# Patient Record
Sex: Female | Born: 1935 | Race: White | Hispanic: No | Marital: Married
Health system: Southern US, Community
[De-identification: ages and names within clinical notes are randomized; demographics above are authoritative.]

---

## 2004-03-30 ENCOUNTER — Other Ambulatory Visit: Payer: Self-pay

## 2004-04-05 ENCOUNTER — Other Ambulatory Visit: Payer: Self-pay

## 2004-04-20 ENCOUNTER — Other Ambulatory Visit: Payer: Self-pay

## 2005-07-19 ENCOUNTER — Ambulatory Visit: Payer: Self-pay | Admitting: Internal Medicine

## 2006-01-14 ENCOUNTER — Other Ambulatory Visit: Payer: Self-pay

## 2006-01-15 ENCOUNTER — Inpatient Hospital Stay: Payer: Self-pay | Admitting: Internal Medicine

## 2006-01-23 ENCOUNTER — Encounter: Payer: Self-pay | Admitting: Internal Medicine

## 2006-07-04 ENCOUNTER — Ambulatory Visit: Payer: Self-pay | Admitting: Unknown Physician Specialty

## 2006-07-23 ENCOUNTER — Ambulatory Visit: Payer: Self-pay | Admitting: Internal Medicine

## 2007-07-25 ENCOUNTER — Ambulatory Visit: Payer: Self-pay | Admitting: Internal Medicine

## 2008-08-05 ENCOUNTER — Ambulatory Visit: Payer: Self-pay | Admitting: Internal Medicine

## 2009-08-12 ENCOUNTER — Ambulatory Visit: Payer: Self-pay | Admitting: Internal Medicine

## 2010-08-15 ENCOUNTER — Ambulatory Visit: Payer: Self-pay | Admitting: Internal Medicine

## 2011-08-17 ENCOUNTER — Ambulatory Visit: Payer: Self-pay | Admitting: Internal Medicine

## 2012-08-20 ENCOUNTER — Ambulatory Visit: Payer: Self-pay | Admitting: Internal Medicine

## 2013-05-28 ENCOUNTER — Ambulatory Visit: Payer: Self-pay | Admitting: Internal Medicine

## 2013-06-09 ENCOUNTER — Ambulatory Visit: Payer: Self-pay | Admitting: Hematology and Oncology

## 2013-06-09 LAB — CBC CANCER CENTER
Basophil #: 0.1 x10 3/mm (ref 0.0–0.1)
Eosinophil %: 2.2 %
HCT: 40 % (ref 35.0–47.0)
HGB: 13.5 g/dL (ref 12.0–16.0)
Lymphocyte %: 9.9 %
MCH: 27.6 pg (ref 26.0–34.0)
MCV: 82 fL (ref 80–100)
Monocyte #: 0.9 x10 3/mm (ref 0.2–0.9)
Monocyte %: 6.6 %
Neutrophil #: 11.2 x10 3/mm — ABNORMAL HIGH (ref 1.4–6.5)
Platelet: 259 x10 3/mm (ref 150–440)
RBC: 4.89 10*6/uL (ref 3.80–5.20)
RDW: 12.8 % (ref 11.5–14.5)

## 2013-06-09 LAB — COMPREHENSIVE METABOLIC PANEL
Albumin: 4 g/dL (ref 3.4–5.0)
Alkaline Phosphatase: 109 U/L (ref 50–136)
BUN: 15 mg/dL (ref 7–18)
Bilirubin,Total: 0.9 mg/dL (ref 0.2–1.0)
Calcium, Total: 10.3 mg/dL — ABNORMAL HIGH (ref 8.5–10.1)
Chloride: 97 mmol/L — ABNORMAL LOW (ref 98–107)
Co2: 29 mmol/L (ref 21–32)
Creatinine: 0.9 mg/dL (ref 0.60–1.30)
EGFR (African American): >60
EGFR (Non-African Amer.): >60
Osmolality: 279 (ref 275–301)
Potassium: 4 mmol/L (ref 3.5–5.1)
SGOT(AST): 33 U/L (ref 15–37)
Total Protein: 9.3 g/dL — ABNORMAL HIGH (ref 6.4–8.2)

## 2013-06-09 LAB — APTT: Activated PTT: 28.6 secs (ref 23.6–35.9)

## 2013-06-09 LAB — PROTIME-INR: INR: 1

## 2013-06-17 ENCOUNTER — Ambulatory Visit: Payer: Self-pay | Admitting: Hematology and Oncology

## 2013-06-18 ENCOUNTER — Inpatient Hospital Stay: Payer: Self-pay | Admitting: Hematology and Oncology

## 2013-06-18 LAB — CBC WITH DIFFERENTIAL/PLATELET
Basophil #: 0.1 10*3/uL (ref 0.0–0.1)
Basophil %: 0.6 %
Eosinophil #: 0.3 10*3/uL (ref 0.0–0.7)
HCT: 34 % — ABNORMAL LOW (ref 35.0–47.0)
HGB: 11.8 g/dL — ABNORMAL LOW (ref 12.0–16.0)
Lymphocyte %: 10.8 %
MCH: 27.7 pg (ref 26.0–34.0)
MCHC: 34.7 g/dL (ref 32.0–36.0)
MCV: 80 fL (ref 80–100)
Neutrophil #: 8.2 10*3/uL — ABNORMAL HIGH (ref 1.4–6.5)
Neutrophil %: 76.6 %
Platelet: 252 10*3/uL (ref 150–440)
RDW: 12.5 % (ref 11.5–14.5)
WBC: 10.7 10*3/uL (ref 3.6–11.0)

## 2013-06-18 LAB — COMPREHENSIVE METABOLIC PANEL
Albumin: 3.2 g/dL — ABNORMAL LOW (ref 3.4–5.0)
Alkaline Phosphatase: 85 U/L (ref 50–136)
Anion Gap: 6 — ABNORMAL LOW (ref 7–16)
BUN: 16 mg/dL (ref 7–18)
Chloride: 96 mmol/L — ABNORMAL LOW (ref 98–107)
Creatinine: 0.74 mg/dL (ref 0.60–1.30)
EGFR (African American): >60
Glucose: 145 mg/dL — ABNORMAL HIGH (ref 65–99)
Osmolality: 268 (ref 275–301)
Potassium: 3.5 mmol/L (ref 3.5–5.1)
SGOT(AST): 33 U/L (ref 15–37)
SGPT (ALT): 23 U/L (ref 12–78)
Total Protein: 7.7 g/dL (ref 6.4–8.2)

## 2013-06-19 LAB — CBC WITH DIFFERENTIAL/PLATELET
Eosinophil %: 0.1 %
HGB: 12.2 g/dL (ref 12.0–16.0)
Lymphocyte #: 0.4 10*3/uL — ABNORMAL LOW (ref 1.0–3.6)
MCH: 27.6 pg (ref 26.0–34.0)
Monocyte %: 1.7 %
Neutrophil #: 9.7 10*3/uL — ABNORMAL HIGH (ref 1.4–6.5)
Neutrophil %: 94 %
Platelet: 260 10*3/uL (ref 150–440)
RBC: 4.41 10*6/uL (ref 3.80–5.20)

## 2013-06-19 LAB — BASIC METABOLIC PANEL
Anion Gap: 7 (ref 7–16)
BUN: 17 mg/dL (ref 7–18)
Calcium, Total: 9.5 mg/dL (ref 8.5–10.1)
Chloride: 96 mmol/L — ABNORMAL LOW (ref 98–107)
Creatinine: 0.73 mg/dL (ref 0.60–1.30)
EGFR (African American): >60
EGFR (Non-African Amer.): >60

## 2013-06-20 LAB — APTT: Activated PTT: 28.2 secs (ref 23.6–35.9)

## 2013-06-20 LAB — PROTIME-INR: INR: 1

## 2013-06-22 LAB — BASIC METABOLIC PANEL
Anion Gap: 3 — ABNORMAL LOW (ref 7–16)
Calcium, Total: 8.9 mg/dL (ref 8.5–10.1)
Co2: 32 mmol/L (ref 21–32)
Creatinine: 0.68 mg/dL (ref 0.60–1.30)
EGFR (African American): >60
EGFR (Non-African Amer.): >60
Potassium: 4 mmol/L (ref 3.5–5.1)
Sodium: 132 mmol/L — ABNORMAL LOW (ref 136–145)

## 2013-06-22 LAB — CBC WITH DIFFERENTIAL/PLATELET
Basophil %: 0.3 %
Eosinophil #: 0.3 10*3/uL (ref 0.0–0.7)
Eosinophil %: 1.8 %
HCT: 30.3 % — ABNORMAL LOW (ref 35.0–47.0)
HGB: 10.3 g/dL — ABNORMAL LOW (ref 12.0–16.0)
Lymphocyte #: 1.2 10*3/uL (ref 1.0–3.6)
Lymphocyte %: 8.3 %
MCH: 27.5 pg (ref 26.0–34.0)
MCHC: 34 g/dL (ref 32.0–36.0)
Monocyte #: 1.3 x10 3/mm — ABNORMAL HIGH (ref 0.2–0.9)
Monocyte %: 9.1 %
Neutrophil #: 11.5 10*3/uL — ABNORMAL HIGH (ref 1.4–6.5)
Neutrophil %: 80.5 %
Platelet: 250 10*3/uL (ref 150–440)
RBC: 3.75 10*6/uL — ABNORMAL LOW (ref 3.80–5.20)
WBC: 14.3 10*3/uL — ABNORMAL HIGH (ref 3.6–11.0)

## 2013-07-04 ENCOUNTER — Ambulatory Visit: Payer: Self-pay | Admitting: Hematology and Oncology

## 2013-08-04 DEATH — deceased

## 2014-10-30 IMAGING — CT CT CHEST W/ CM
1 series · 15 of 34 positions shown, 19 images · non-contrast
Comparison: none

REASON FOR EXAM: LABS 1ST Abn CXR nodular density
COMMENTS:

[Series 2: soft tissue · axial · 0.61mm/px · z∈[-688,-421]mm · 15 of 105 slices shown, 19 images]
[im 8/105  mediastinal]
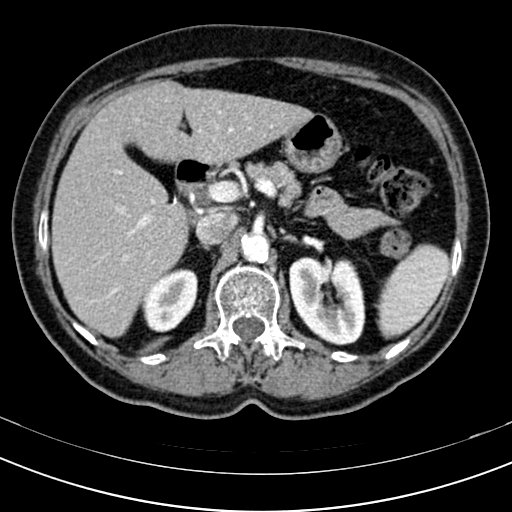
[im 8/105  lung]
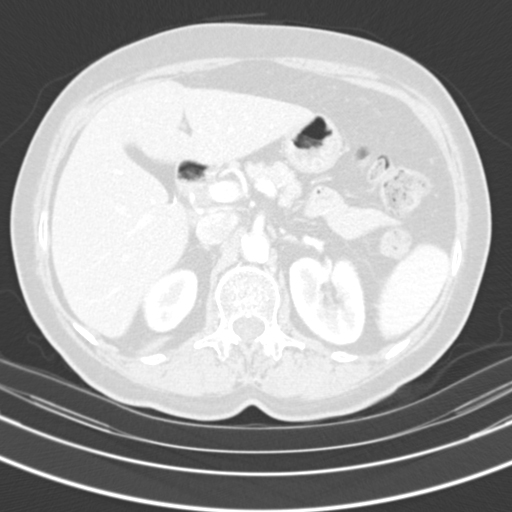
[im 16/105  lung]
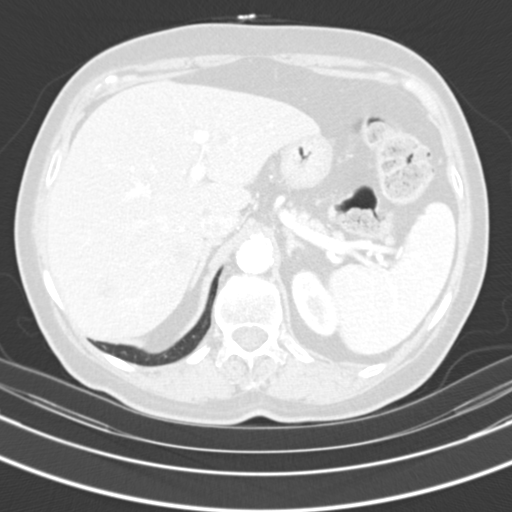
[im 21/105  lung]
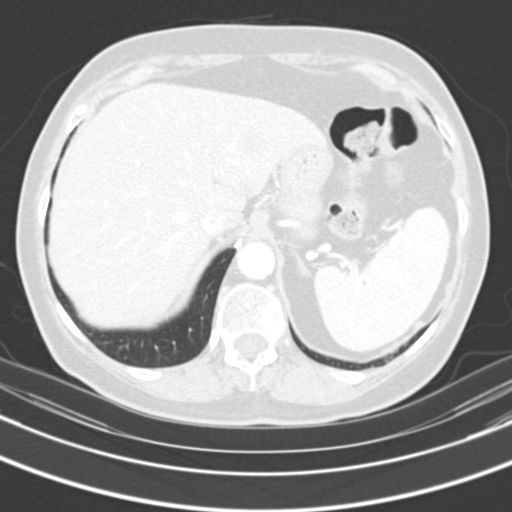
[im 27/105  lung]
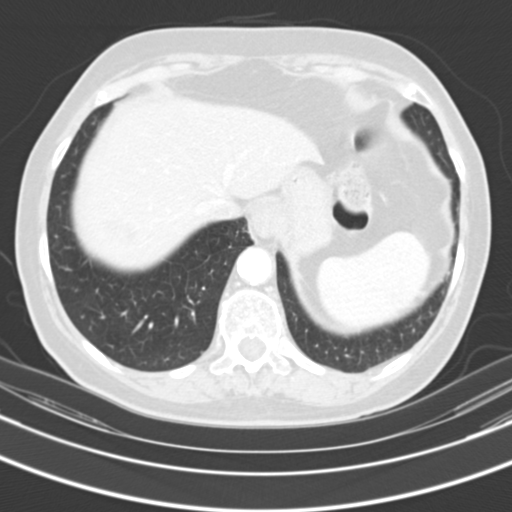
[im 35/105  mediastinal]
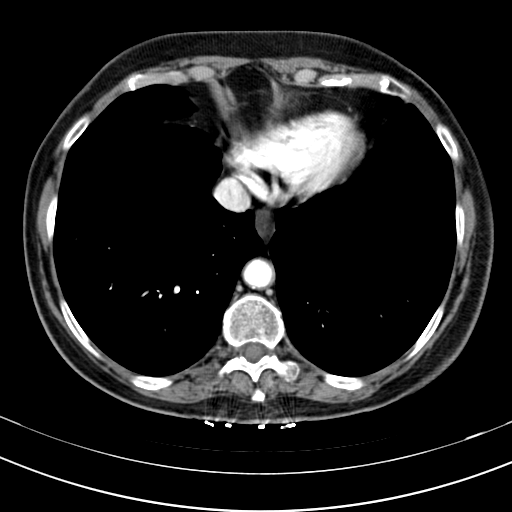
[im 35/105  lung]
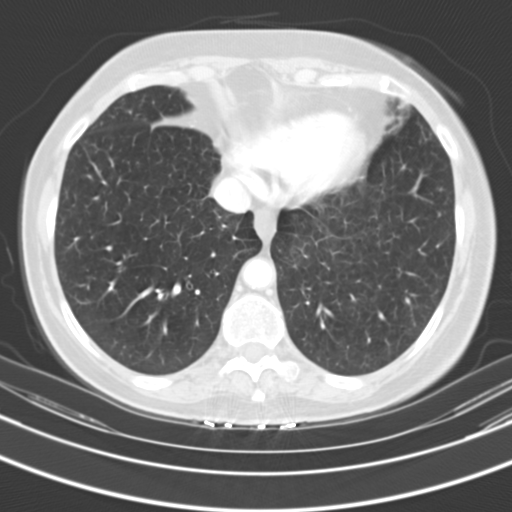
[im 42/105  lung]
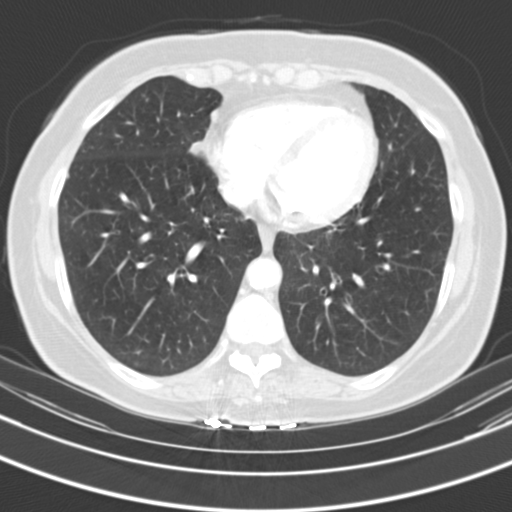
[im 47/105  lung]
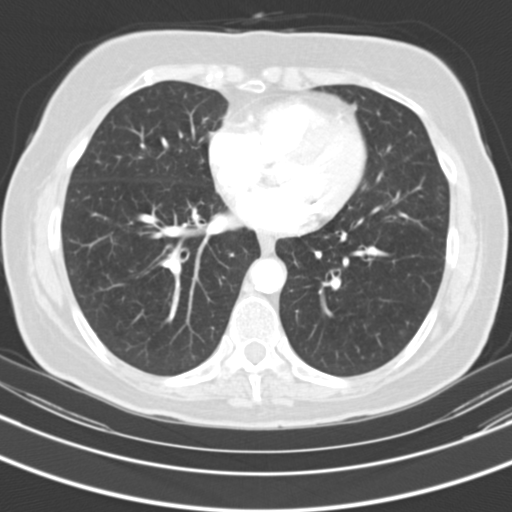
[im 54/105  lung]
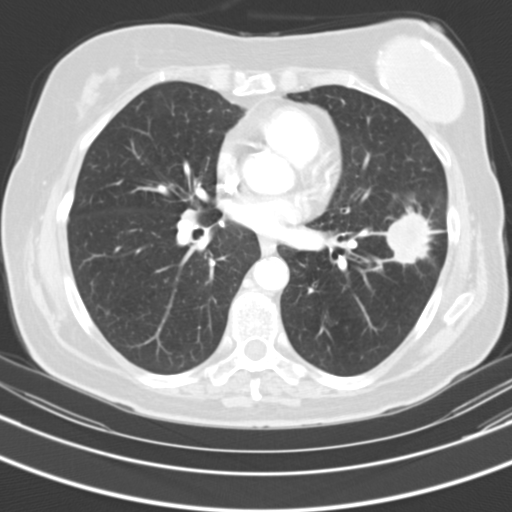
[im 58/105  mediastinal]
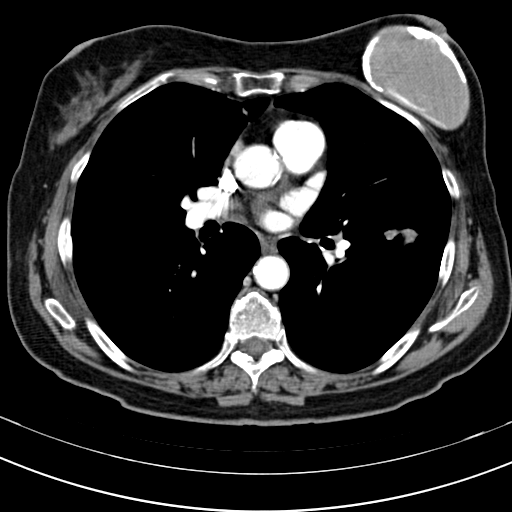
[im 58/105  lung]
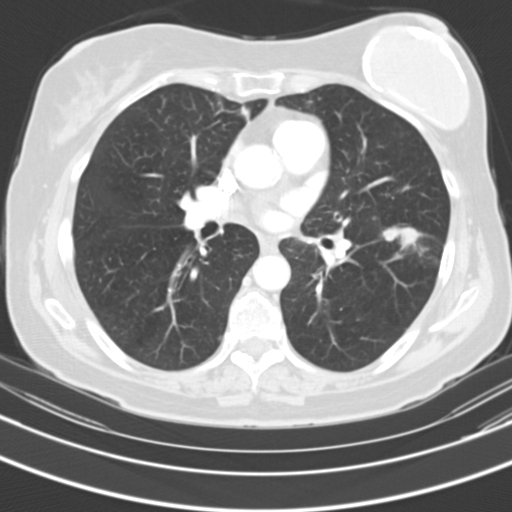
[im 63/105  lung]
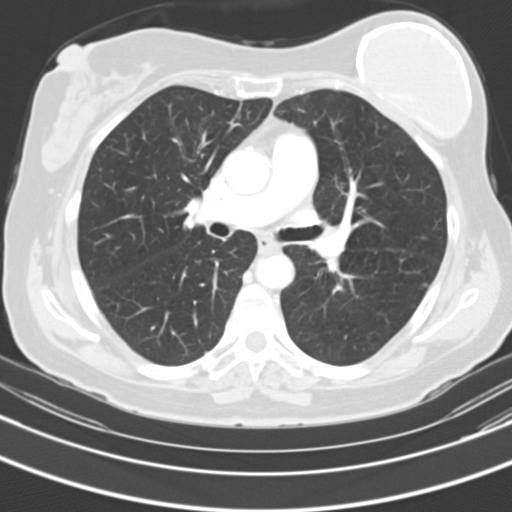
[im 70/105  lung]
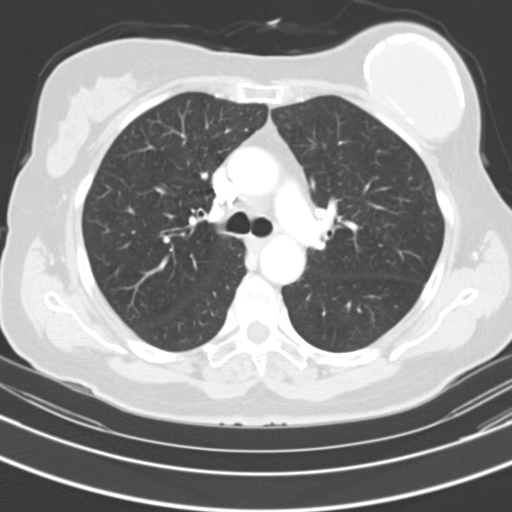
[im 78/105  lung]
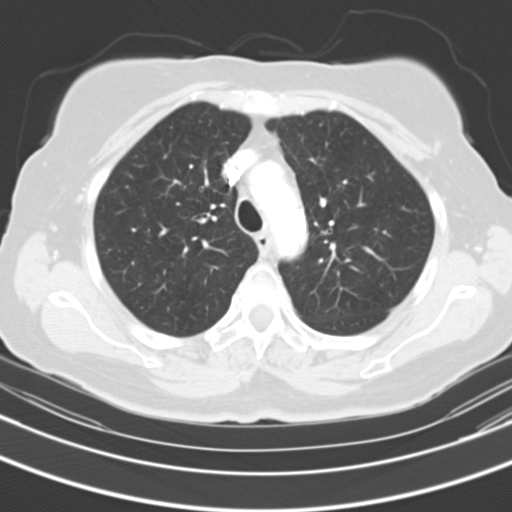
[im 84/105  mediastinal]
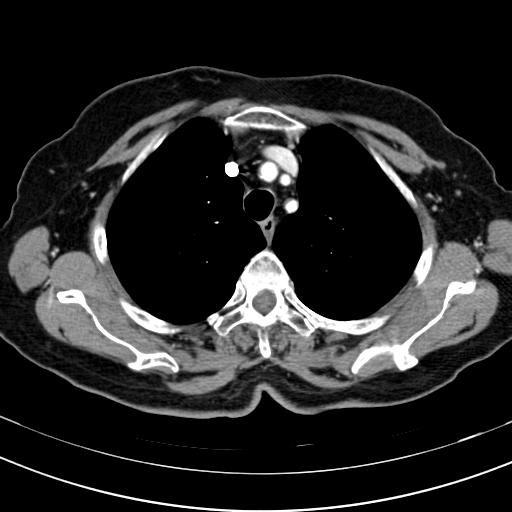
[im 84/105  lung]
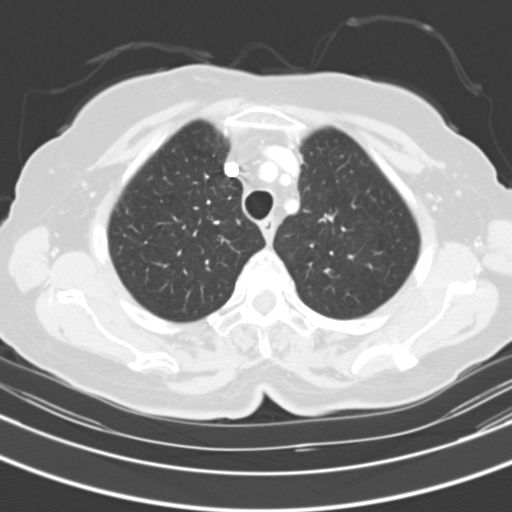
[im 89/105  lung]
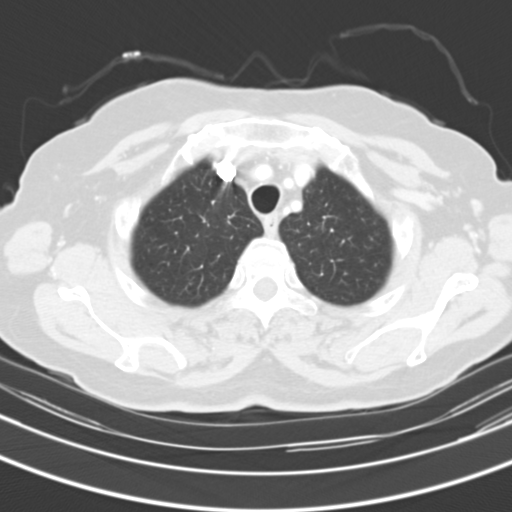
[im 97/105  lung]
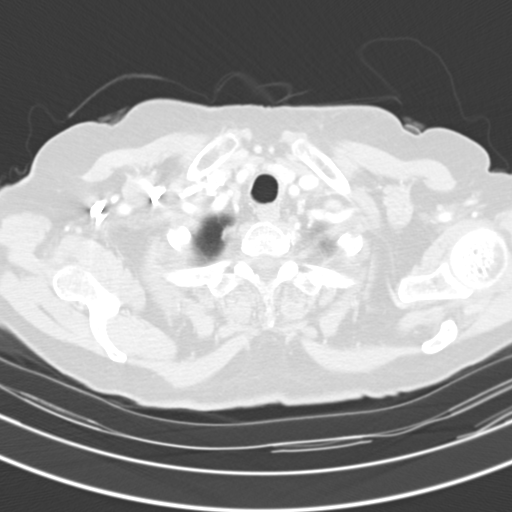

[15 of 34 positions shown; findings below may reference images not displayed]

PROCEDURE:     JIM - JIM CHEST WITH CONTRAST  - May 28, 2013 [DATE]

RESULT:     Chest CT is performed to 75 mL of Wsovue-GUE iodinated
intravenous contrast with images reconstructed at 3.0 mm slice thickness in
the axial plane. There is no previous similar study for comparison.

Images demonstrate a large irregularly marginated mass in the left lower
lobe abutting the major fissure and measuring up to is much as 3.35 cm
anterior to posterior and 2.63 cm transversely at lung window images on
image 51. Findings are worrisome for malignancy. No additional lung masses
are present area is mild thickening of the interstitial. There is nodularity
in the right lower lobe on image 51 measuring up to 5 mm anterior to
posterior at lung window settings. No consolidation is seen. Apical fibrotic
changes are noted bilaterally. No endobronchial lesion is evident. The heart
is normal size. There is no pleural or pericardial effusion. The included
thyroid lobes enhance normally. Calcification is seen within the left breast
implant periphery. There is no axillary mass or adenopathy. Nonenlarged
mediastinal lymph nodes are present. The included upper abdominal structures
show no evidence of a focal adrenal mass or diffuse adrenal enlargement.
There is ill-defined low-attenuation within the right lobe of the liver
subdiaphragmatic region on image 79 measuring approximately 2.1 cm
transversely and 1.6 cm anterior to posterior. There is ill-defined
low-attenuation in the left lobe of the liver on image 82 measuring 9.1 mm.
There is additional abnormal low-attenuation in the left lobe of the liver
on image 85 measuring up to a on. Multiple smaller areas are seen in the
right lobe. Spleen appears to be unremarkable in the included areas.
IMPRESSION: 1. Findings concerning for left lower lobe malignancy with multiple hepatic
metastatic foci. PET CT followup and oncologic consultation recommended.

[REDACTED].(*)

## 2015-03-26 NOTE — Consult Note (Signed)
Brief Consult Note: Diagnosis: T12 compression fracture.   Comments: Ms. Autumn Sanford is an 81 year old female who was recently diagnosed with lung cancer.  She sustained a compression fracture of T12 vertebral body while pushing her lawn mower two days ago.  She localizes pain to lower thoracic spine.  She denies numbness, tingling.  She denies loss of bowel or bladder control.  She denies new weakness.  PE: preserved symmetric strength of b/l uppe extremities and lower extremities.  Preserved sensation to light touch from C2-S2 dermatomes. No pain with motion of neck.  Negative Spurlings. No pain with motion of legs. Moderate TTP of lower thoracic spine.   MRI spine: compression fracture, acute appearing of T12 vertebral body.  Assessment:  As above  Plan: Contacted Dr. Wendie Simmeramiah who is this patient's oncologist to assess need for biopsy for tissue or simply pain control.  She will not need tissue from vertebral body.    Will speak with Kennedy BuckerMichael Menz, MD, to discuss patient will be candidate for kyphoplasty.  Normal protocol is to attempt non-op course with pain control and possible bracing.  If no improvement in 1-2 weeks, then consider procedure.  Will follow-up with plan from Dr. Rosita KeaMenz.  Will order brace.  Electronic Signatures: Murlean Harkamasunder, Daeshon Grammatico (MD)  (Signed 17-Jul-14 15:40)  Authored: Brief Consult Note   Last Updated: 17-Jul-14 15:40 by Murlean Harkamasunder, Dannis Deroche (MD)
